# Patient Record
Sex: Female | Born: 1940 | Race: Black or African American | Hispanic: No | Marital: Married | Smoking: Never smoker
Health system: Southern US, Community
[De-identification: ages and names within clinical notes are randomized; demographics above are authoritative.]

---

## 2011-07-15 ENCOUNTER — Emergency Department: Payer: Self-pay | Admitting: Emergency Medicine

## 2011-07-15 LAB — COMPREHENSIVE METABOLIC PANEL
Alkaline Phosphatase: 50 U/L (ref 50–136)
Anion Gap: 12 (ref 7–16)
Calcium, Total: 9.6 mg/dL (ref 8.5–10.1)
Chloride: 102 mmol/L (ref 98–107)
Co2: 25 mmol/L (ref 21–32)
EGFR (African American): >60
EGFR (Non-African Amer.): >60
Osmolality: 281 (ref 275–301)
SGPT (ALT): 24 U/L
Sodium: 139 mmol/L (ref 136–145)

## 2011-07-15 LAB — CBC
MCH: 34.4 pg — ABNORMAL HIGH (ref 26.0–34.0)
MCHC: 33.5 g/dL (ref 32.0–36.0)
MCV: 103 fL — ABNORMAL HIGH (ref 80–100)
Platelet: 178 10*3/uL (ref 150–440)
RBC: 3.8 10*6/uL (ref 3.80–5.20)
RDW: 14.2 % (ref 11.5–14.5)

## 2011-07-15 LAB — TROPONIN I: Troponin-I: <0.02 ng/mL

## 2017-09-14 ENCOUNTER — Other Ambulatory Visit (HOSPITAL_COMMUNITY): Payer: Self-pay | Admitting: Physician Assistant

## 2017-09-14 DIAGNOSIS — M545 Low back pain: Principal | ICD-10-CM

## 2017-09-14 DIAGNOSIS — G8929 Other chronic pain: Secondary | ICD-10-CM

## 2017-10-05 ENCOUNTER — Encounter (HOSPITAL_COMMUNITY): Payer: Self-pay | Admitting: Radiology

## 2017-10-05 ENCOUNTER — Ambulatory Visit (HOSPITAL_COMMUNITY)
Admission: RE | Admit: 2017-10-05 | Discharge: 2017-10-05 | Disposition: A | Discharge status: Home / Self Care | Payer: Self-pay | Source: Ambulatory Visit | Attending: Physician Assistant | Admitting: Physician Assistant

## 2017-10-05 DIAGNOSIS — M5126 Other intervertebral disc displacement, lumbar region: Secondary | ICD-10-CM | POA: Insufficient documentation

## 2017-10-05 DIAGNOSIS — G8929 Other chronic pain: Secondary | ICD-10-CM | POA: Insufficient documentation

## 2017-10-05 DIAGNOSIS — M5441 Lumbago with sciatica, right side: Secondary | ICD-10-CM | POA: Insufficient documentation

## 2017-10-05 DIAGNOSIS — M48061 Spinal stenosis, lumbar region without neurogenic claudication: Secondary | ICD-10-CM | POA: Insufficient documentation

## 2017-10-05 DIAGNOSIS — M5442 Lumbago with sciatica, left side: Secondary | ICD-10-CM | POA: Insufficient documentation

## 2017-10-05 DIAGNOSIS — M545 Low back pain: Secondary | ICD-10-CM

## 2017-10-13 ENCOUNTER — Ambulatory Visit (INDEPENDENT_AMBULATORY_CARE_PROVIDER_SITE_OTHER): Payer: Self-pay | Admitting: Orthopaedic Surgery

## 2017-10-13 DIAGNOSIS — M167 Other unilateral secondary osteoarthritis of hip: Secondary | ICD-10-CM

## 2017-10-13 NOTE — Progress Notes (Signed)
Office Visit Note   Patient: Amber Gross           Date of Birth: Oct 30, 1940           MRN: [id number] Visit Date: 10/13/2017              Requested by: Alyson Ingles, PA-C [address] [place], [place] [address] PCP: Default, Provider, MD   Assessment & Plan: Visit Diagnoses:  1. Other secondary osteoarthritis of right hip     Plan: Impression 77 year old female with severe degenerative joint disease of her right hip.  At this point patient has failed conservative treatment.  I do not think an intra-articular steroid injections and to give her any meaningful relief.  Recommendation is for a total hip replacement at this point.  We discussed risks and benefits associated with the surgery.  We discussed postoperative recovery and rehab.  Patient recently had workup by cardiologist and PCP for thoracic and abdominal aortic aneurysm which were deemed to be stable and recommendations were for monitoring.  We will schedule her surgery in the near future.  All parties in agreement today. Total face to face encounter time was greater than 45 minutes and over half of this time was spent in counseling and/or coordination of care.  Follow-Up Instructions: Return if symptoms worsen or fail to improve.   Orders:  Orders Placed This Encounter  Procedures  . Ambulatory referral to Physical Medicine Rehab   No orders of the defined types were placed in this encounter.     Procedures: No procedures performed   Clinical Data: No additional findings.   Subjective: Chief Complaint  Patient presents with  . Lower Back - Pain  . Right Hip - Pain    Patient is a 77 year old female who presents with severe right hip pain that has progressively gotten worse over the last several years.  She has severe difficulty with ADLs and chronic pain.  She has tried extensive conservative treatment with NSAIDs and gabapentin and use of a cane.  She has difficulty sleeping secondary to the pain.   She did have an MRI of her lumbar spine which was relatively unremarkable.  She was found to have severe degenerative joint disease of her right hip on x-ray.   Review of Systems  Constitutional: Negative.   HENT: Negative.   Eyes: Negative.   Respiratory: Negative.   Cardiovascular: Negative.   Endocrine: Negative.   Musculoskeletal: Negative.   Neurological: Negative.   Hematological: Negative.   Psychiatric/Behavioral: Negative.   All other systems reviewed and are negative.    Objective: Vital Signs: There were no vitals taken for this visit.  Physical Exam  Constitutional: She is oriented to person, place, and time. She appears well-developed and well-nourished.  HENT:  Head: Normocephalic and atraumatic.  Eyes: EOM are normal.  Neck: Neck supple.  Pulmonary/Chest: Effort normal.  Abdominal: Soft.  Neurological: She is alert and oriented to person, place, and time.  Skin: Skin is warm. Capillary refill takes less than 2 seconds.  Psychiatric: She has a normal mood and affect. Her behavior is normal. Judgment and thought content normal.  Nursing note and vitals reviewed.   Ortho Exam Right hip exam shows significant pain with range of motion and catching and limitation.  Positive Stinchfield sign.  Negative sciatic tension.  Right leg is about an inch shorter than the left leg. Specialty Comments:  No specialty comments available.  Imaging: No results found.   PMFS History: There  are no active problems to display for this patient.  No past medical history on file.  No family history on file.   Social History   Occupational History  . Not on file  Tobacco Use  . Smoking status: Not on file  Substance and Sexual Activity  . Alcohol use: Not on file  . Drug use: Not on file  . Sexual activity: Not on file

## 2017-11-09 ENCOUNTER — Telehealth (INDEPENDENT_AMBULATORY_CARE_PROVIDER_SITE_OTHER): Payer: Self-pay | Admitting: Orthopaedic Surgery

## 2017-11-09 NOTE — Telephone Encounter (Signed)
Please advise 

## 2017-11-09 NOTE — Telephone Encounter (Signed)
Patient called requesting a note findings of xrays and that she needs surgery. Note needs to include date she is having surgery and name of procedure. Call when ready. [phone]

## 2017-11-09 NOTE — Telephone Encounter (Signed)
Can you please let me know what day surgery is scheduled for so that I can fix note for patient?

## 2017-11-09 NOTE — Telephone Encounter (Signed)
Right hip DJD and right total hip replacement

## 2017-11-10 NOTE — Telephone Encounter (Signed)
Please see below. Can you let me know about surgery date?

## 2017-11-12 NOTE — Telephone Encounter (Signed)
Amber Gross, when is surgery?  You hold the key to the [facility]

## 2017-11-12 NOTE — Telephone Encounter (Signed)
Can you check on surgery date so we can fix patient note? Thanks.

## 2017-11-12 NOTE — Telephone Encounter (Signed)
Do you know when the SU was/is? Sherrie has not responded. Thank you.

## 2017-11-13 NOTE — Telephone Encounter (Signed)
Was planning for it to be on Thursday, June 13 but since your OR schedule changed I have to call patient and reschedule for a different date.

## 2017-11-13 NOTE — Telephone Encounter (Signed)
Ok thanks 

## 2017-11-18 NOTE — Telephone Encounter (Signed)
I called patient and left voice mail for return call to schedule surgery. 

## 2017-12-02 NOTE — Telephone Encounter (Signed)
Spoke with patient, surgery now planned for 01/04/18.

## 2017-12-03 ENCOUNTER — Encounter (INDEPENDENT_AMBULATORY_CARE_PROVIDER_SITE_OTHER): Payer: Self-pay

## 2017-12-03 NOTE — Telephone Encounter (Signed)
Called patient letter ready for pick up.

## 2017-12-11 ENCOUNTER — Other Ambulatory Visit (INDEPENDENT_AMBULATORY_CARE_PROVIDER_SITE_OTHER): Payer: Self-pay

## 2017-12-25 NOTE — Pre-Procedure Instructions (Signed)
Amber Gross  12/25/2017    Your procedure is scheduled on Monday, January 04, 2018 at 7:30 AM.   [facility] at 5:30 AM.   Call this number if you have problems the morning of surgery: [phone]   Questions prior to day of surgery, please call [phone] between 8 & 4 PM.   Remember:  Do not eat or drink after midnight Sunday, 01/03/18.  Take these medicines the morning of surgery with A SIP OF WATER:  Stop Aspirin as instructed by physician. Do not use NSAIDS (Ibuprofen, Aleve, etc) prior to surgery.    Do not wear jewelry, make-up or nail polish.  Do not wear lotions, powders, perfumes or deodorant.  Do not shave 48 hours prior to surgery.    Do not bring valuables to the hospital.  [facility] is not responsible for any belongings or valuables.  Contacts, dentures or bridgework may not be worn into surgery.  Leave your suitcase in the car.  After surgery it may be brought to your room.  For patients admitted to the hospital, discharge time will be determined by your treatment team.  Patients discharged the day of surgery will not be allowed to drive home.   [place] - Preparing for Surgery  Before surgery, you can play an important role.  Because skin is not sterile, your skin needs to be as free of germs as possible.  You can reduce the number of germs on you skin by washing with CHG (chlorahexidine gluconate) soap before surgery.  CHG is an antiseptic cleaner which kills germs and bonds with the skin to continue killing germs even after washing.  Oral Hygiene is also important in reducing the risk of infection.  Remember to brush your teeth with your regular toothpaste the morning of surgery.  Please DO NOT use if you have an allergy to CHG or antibacterial soaps.  If your skin becomes reddened/irritated stop using the CHG and inform your nurse when you arrive at Short Stay.  Do not shave (including legs and underarms) for  at least 48 hours prior to the first CHG shower.  You may shave your face.  Please follow these instructions carefully:   1.  Shower with CHG Soap the night before surgery and the morning of Surgery.  2.  If you choose to wash your hair, wash your hair first as usual with your normal shampoo.  3.  After you shampoo, rinse your hair and body thoroughly to remove the shampoo. 4.  Use CHG as you would any other liquid soap.  You can apply chg directly to the skin and wash gently with a      scrungie or washcloth.           5.  Apply the CHG Soap to your body ONLY FROM THE NECK DOWN.   Do not use on open wounds or open sores. Avoid contact with your eyes, ears, mouth and genitals (private parts).  Wash genitals (private parts) with your normal soap.  6.  Wash thoroughly, paying special attention to the area where your surgery will be performed.  7.  Thoroughly rinse your body with warm water from the neck down.  8.  DO NOT shower/wash with your normal soap after using and rinsing off the CHG Soap.  9.  Pat yourself dry with a clean towel.            10 .  Wear clean pajamas.  11.  Place clean sheets on your bed the night of your first shower and do not sleep with pets.  Day of Surgery  Shower as above. Do not apply any lotions/deodorants the morning of surgery.   Please wear clean clothes to the hospital. Remember to brush your teeth with toothpaste.   Please read over the fact sheets that you were given.

## 2017-12-28 ENCOUNTER — Inpatient Hospital Stay (HOSPITAL_COMMUNITY)
Admission: RE | Admit: 2017-12-28 | Discharge: 2017-12-28 | Disposition: A | Discharge status: Home / Self Care | Payer: Self-pay | Source: Ambulatory Visit

## 2017-12-28 ENCOUNTER — Encounter (HOSPITAL_COMMUNITY): Payer: Self-pay

## 2017-12-28 NOTE — Progress Notes (Signed)
Pt called and stated that she is needs to cancel this PAT appt because she is having to reschedule her surgery.

## 2017-12-31 ENCOUNTER — Inpatient Hospital Stay (INDEPENDENT_AMBULATORY_CARE_PROVIDER_SITE_OTHER): Payer: Self-pay | Admitting: Orthopaedic Surgery

## 2018-01-04 ENCOUNTER — Encounter (HOSPITAL_COMMUNITY): Admission: RE | Payer: Self-pay | Source: Ambulatory Visit

## 2018-01-04 ENCOUNTER — Inpatient Hospital Stay (HOSPITAL_COMMUNITY): Admission: RE | Admit: 2018-01-04 | Payer: Self-pay | Source: Ambulatory Visit | Admitting: Orthopaedic Surgery

## 2018-01-04 SURGERY — ARTHROPLASTY, HIP, TOTAL, ANTERIOR APPROACH
Anesthesia: Spinal | Site: Hip | Laterality: Right

## 2019-01-14 IMAGING — MR MR LUMBAR SPINE W/O CM
4 of 5 series · 26 of 48 positions shown · non-contrast
Comparison: Plain films 09/14/2017.

CLINICAL DATA: Low back pain extending to RIGHT hip, symptoms for
many years, worsening with time. No known injury.

EXAM:
MRI LUMBAR SPINE WITHOUT CONTRAST
TECHNIQUE: Multiplanar, multisequence MR imaging of the lumbar spine was
performed. No intravenous contrast was administered.

[Series 9001: T2 · sagittal · 4.0mm · 0.73mm/px · 6 of 13 slices shown (1 of 2)]
[im 1/13]
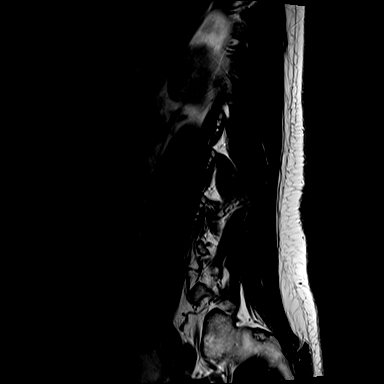
[im 3/13]
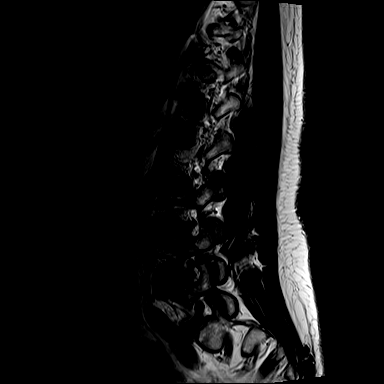
[im 5/13]
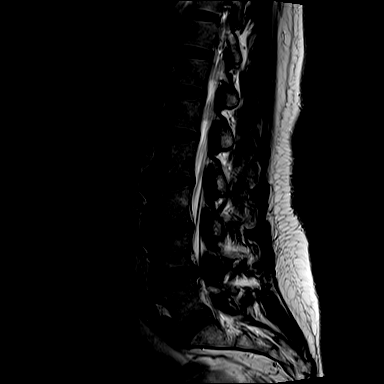
[im 8/13]
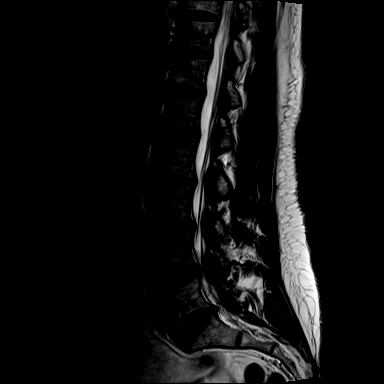
[im 10/13]
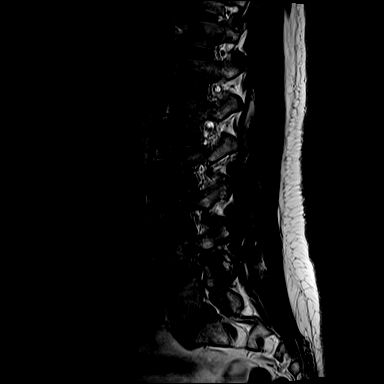
[im 13/13]
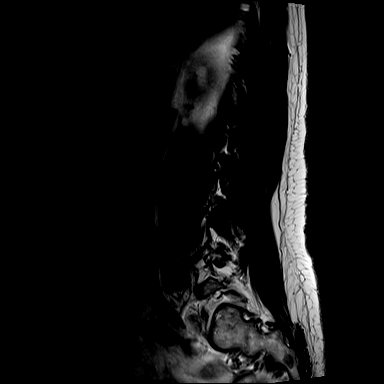

[T1 · sagittal · 4.0mm · 0.88mm/px · 6 of 13 slices shown (1 of 2)]
[im 1/13]
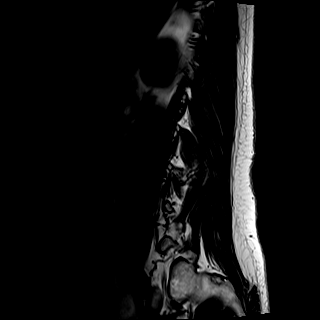
[im 3/13]
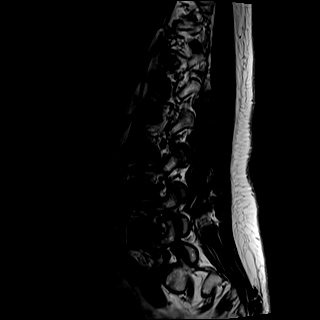
[im 5/13]
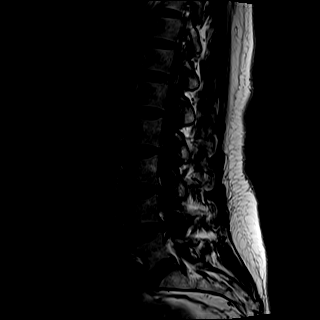
[im 8/13]
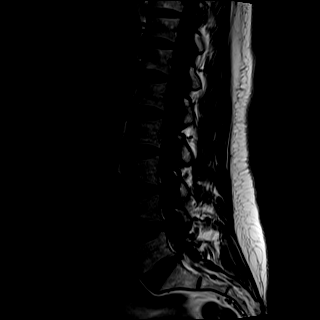
[im 10/13]
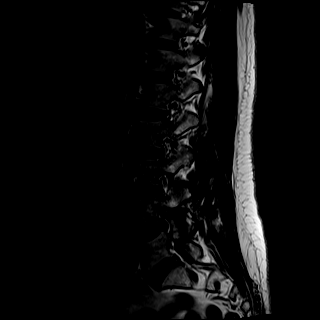
[im 13/13]
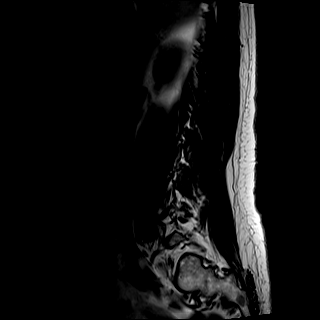

[T2 · axial · 4.0mm · 0.57mm/px · z∈[-50,+160]mm · 9 of 34 slices shown (2 of 2)]
[im 1/34]
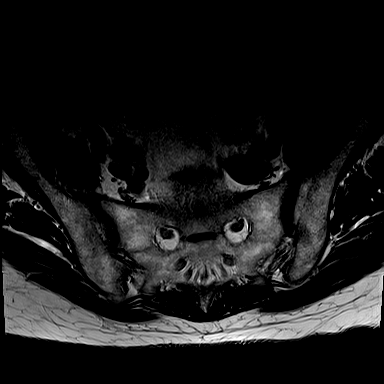
[im 5/34]
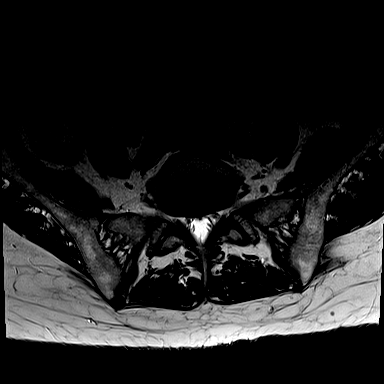
[im 10/34]
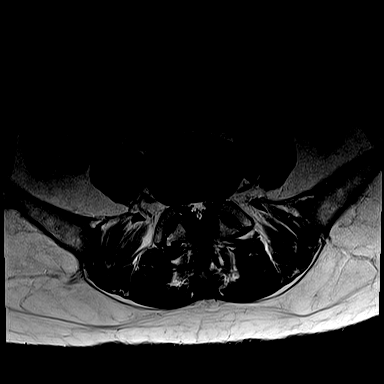
[im 15/34]
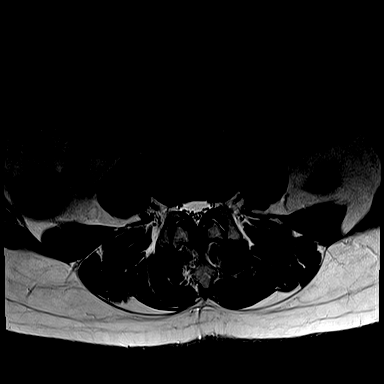
[im 17/34]
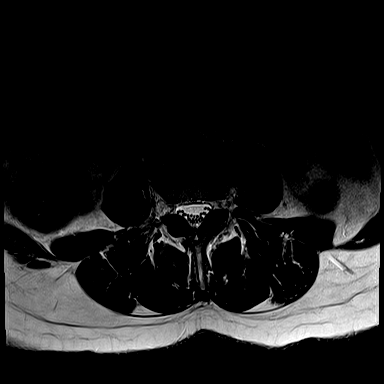
[im 19/34]
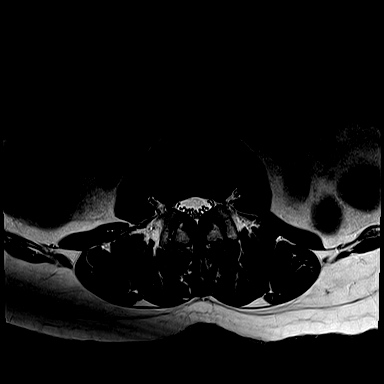
[im 24/34]
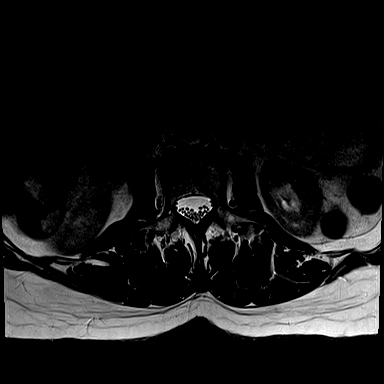
[im 29/34]
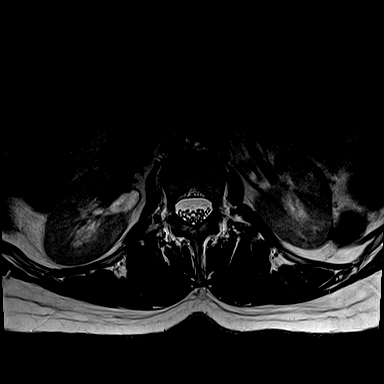
[im 34/34]
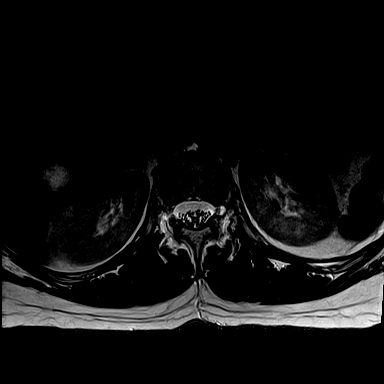

[T1 · axial · 4.0mm · 0.34mm/px · z∈[-50,+136]mm · 5 of 34 slices shown (2 of 2)]
[im 1/34]
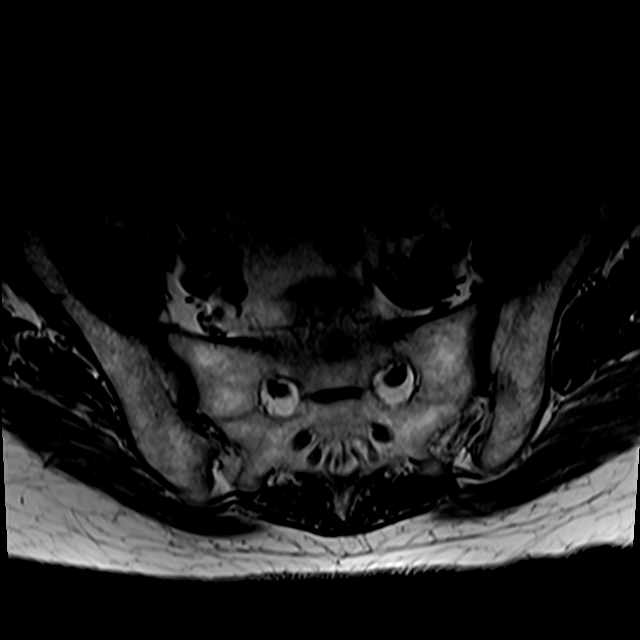
[im 5/34]
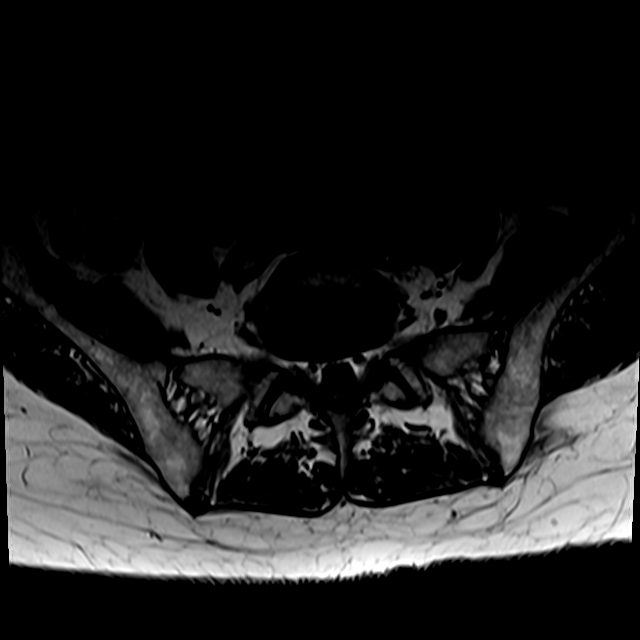
[im 10/34]
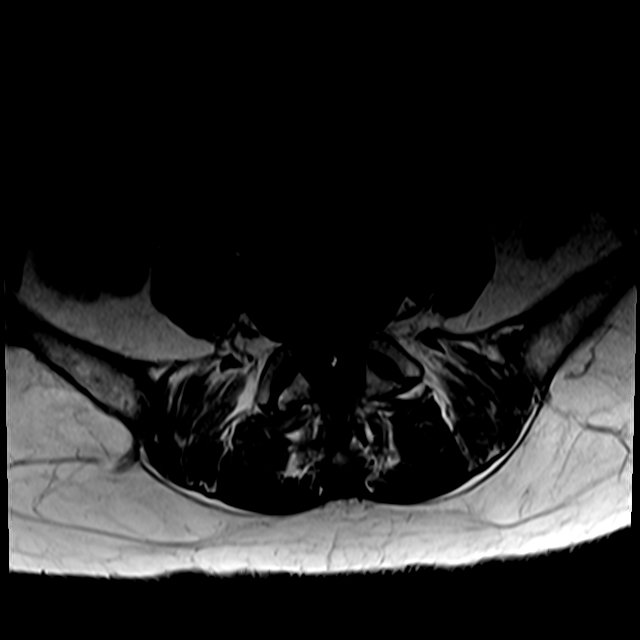
[im 17/34]
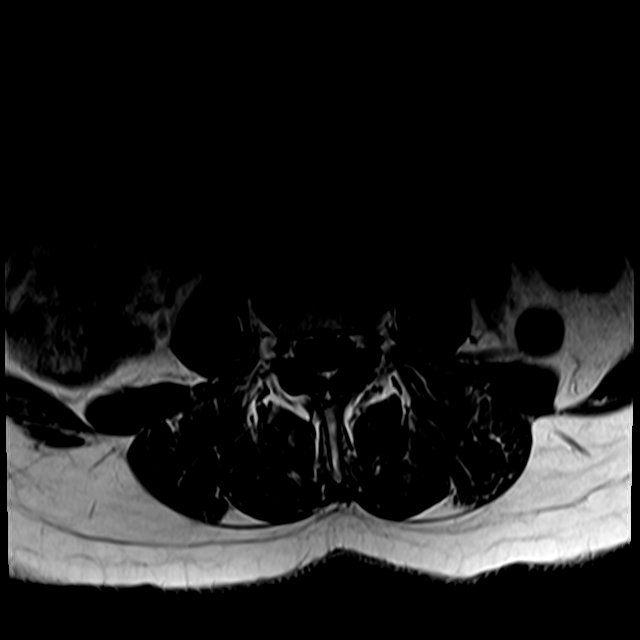
[im 29/34]
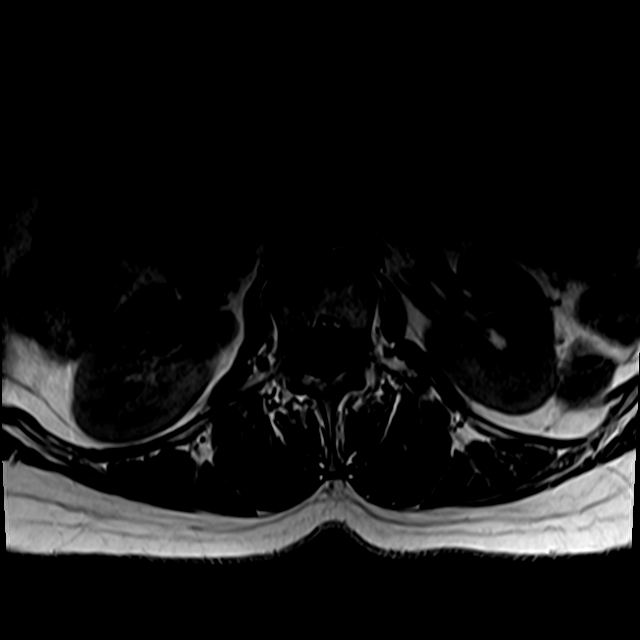

[26 of 48 positions shown; findings below may reference images not displayed]

FINDINGS: Segmentation:  Standard.

Alignment:  Anatomic.

Vertebrae:  No fracture, diskitis, or bone lesion.

Conus medullaris and cauda equina: Conus extends to the L1. level.
Conus and cauda equina appear normal.

Paraspinal and other soft tissues: Unremarkable.

Disc levels:

L1-L2:  Annular bulge.  Facet arthropathy.  No impingement.

L2-L3:  Annular bulge.  Facet arthropathy.  No impingement.

L3-L4: Shallow central protrusion. Posterior element hypertrophy. No
impingement.

L4-L5: Shallow central protrusion extends to both foramina. There is
posterior element hypertrophy affecting facets ligamentum flavum.
Mild stenosis. BILATERAL subarticular zone and foraminal zone
narrowing could affect the L4 and L5 nerve roots.

L5-S1: Focal protrusion in the midline/annular rent. Minimal mass
effect on the RIGHT greater than LEFT S1 nerve roots. No foraminal
narrowing of significance.
IMPRESSION: The dominant abnormality is at L4-5, where a shallow central
protrusion and posterior element hypertrophy contribute to mild
stenosis. Correlate clinically for symptomatic L4 and/or L5 nerve
root impingement.

Shallow protrusions at L3-4 and L5-S1, without significant mass
effect or spinal stenosis.

## 2024-07-28 ENCOUNTER — Ambulatory Visit: Payer: Self-pay

## 2024-07-28 NOTE — Telephone Encounter (Signed)
 FYI Only or Action Required?: FYI only for provider: ED advised.  Patient was last seen in primary care on N/A.  Called Nurse Triage reporting Chest Pain, Headache, Constipation, and Dizziness.  Symptoms began several weeks ago.  Interventions attempted: Other: followed up with cardiology and GI.  Symptoms are: becoming more frequent.  Triage Disposition: Go to ED Now (Notify PCP)  Patient/caregiver understands and will follow disposition?: Yes              Message from [facility] sent at 07/28/2024  9:00 AM EST  Reason for Triage: Chest pains, had work ups already and nothing showing wrong dizziness and tension   Reason for Disposition  [1] Chest pain (or angina) comes and goes AND [2] is happening more often (increasing in frequency) or getting worse (increasing in severity)  (Exception: Chest pains that last only a few seconds.)  Answer Assessment - Initial Assessment Questions Daughter, Reena, on the phone for triage. States she is with patient. Calling in for left upper chest pain that comes and goes but becoming more frequent. Last time it occurred was yesterday, not present now (30 minutes to the entire day duration). Also mentions tension in head, constipation and dizziness.Patient reports to daughter :Chest pain feels related to her constipation. RN scheduled patient for new patient appt and advised daughter for patient to be seen in ED now.   No difficulty breathing,  Protocols used: Chest Pain-A-AH

## 2024-08-03 ENCOUNTER — Encounter: Payer: Self-pay | Admitting: Family Medicine

## 2024-08-03 ENCOUNTER — Ambulatory Visit
Admission: RE | Admit: 2024-08-03 | Discharge: 2024-08-03 | Disposition: A | Discharge status: Home / Self Care | Payer: Self-pay | Attending: Family Medicine | Admitting: Family Medicine

## 2024-08-03 ENCOUNTER — Ambulatory Visit
Admission: RE | Admit: 2024-08-03 | Discharge: 2024-08-03 | Disposition: A | Payer: Self-pay | Source: Ambulatory Visit | Attending: Family Medicine | Admitting: Family Medicine

## 2024-08-03 ENCOUNTER — Ambulatory Visit: Payer: Self-pay | Admitting: Family Medicine

## 2024-08-03 VITALS — BP 170/84 | HR 77 | Temp 98.3°F | Resp 16 | Ht 65.0 in | Wt 152.6 lb

## 2024-08-03 DIAGNOSIS — I2510 Atherosclerotic heart disease of native coronary artery without angina pectoris: Secondary | ICD-10-CM | POA: Insufficient documentation

## 2024-08-03 DIAGNOSIS — R0789 Other chest pain: Secondary | ICD-10-CM | POA: Insufficient documentation

## 2024-08-03 DIAGNOSIS — R030 Elevated blood-pressure reading, without diagnosis of hypertension: Secondary | ICD-10-CM

## 2024-08-03 DIAGNOSIS — R079 Chest pain, unspecified: Secondary | ICD-10-CM

## 2024-08-03 DIAGNOSIS — I25118 Atherosclerotic heart disease of native coronary artery with other forms of angina pectoris: Secondary | ICD-10-CM

## 2024-08-03 NOTE — Assessment & Plan Note (Addendum)
 She reports that her chest tightness seems to be getting worse in the last couple of weeks.  She denies pain to swallow, shortness of breath, nausea, diaphoresis, presyncope.  She has never been a smoker and she does not drink alcohol.  No diabetes.  Left heart catheterization 03/04/2024 showed no atheromatous or obstructive lesions in left main, LAD has partially calcified plaque throughout the proximal LAD resulting in less than 25% stenosis no significant plaque or stenosis in the prominent diagonal branch and septal perforator branches. Left circumflex artery has no atheromatous or obstructive disease. RCA shows a partially calcified plaque involving mid to proximal to mid RCA with less than 30% stenosis.  Oral sublingual nitroglycerin does improve the chest tightness but causes dizziness. EKG was normal without ST or T wave changes associated with ischemia.   Asked her to check a chest x-ray today and CMP, CBC, thyroid and lipids

## 2024-08-03 NOTE — Progress Notes (Signed)
 "  New Patient Office Visit  Subjective    Patient ID: Amber Gross, female    DOB: June 09, 1941  Age: 84 y.o. MRN: [id number]  CC:  Chief Complaint  Patient presents with   Establish Care    Here to establish care.   Chest Pain    Left upper chest pain that comes and goes but becoming more frequent. Also mentioned tension in head, constipation and dizziness.Patient reports to daughter :Chest pain feels related to her constipation. Was advised to go ED but she said it was not severe enough. They have copies of angiograms done in past.    HPI Amber Gross presents to establish care. Discussed the use of AI scribe software for clinical note transcription with the patient, who gave verbal consent to proceed.  History of Present Illness   Amber Gross is an 84 year old female with coronary artery disease who presents with chest tightness. She is accompanied by her daughter, Amber Gross.  She experiences a constant sensation of tightness or tension in her chest, which has been worsening over the past month. The sensation, described as a feeling of 'something's not right' rather than a throbbing pain, has been present since mid-December and is rated as an 8 out of 10 in intensity.  Despite extensive cardiac workup, including a LHC 03/04/2024 that showed no erythematous or obstructive lesions in left main, LAD has partially calcified plaque throughout the proximal LAD resulting in less than 25% stenosis no significant plaque or stenosis in the prominent diagonal branch and septal perforator branches.  Left circumflex artery has no erythematous or obstructive disease.  RCA shows a partially calcified plaque involving mid to proximal to mid RCA with less than 30% stenosis.  Cardiac chambers are normal in volume and without filling defect the atrial septum is intact, trileaflet aortic valve without leaflet thickening or calcification.  Coronary calcium score 185 which is 65th percentile for coronary artery  disease in comparison to asymptomatic patients of same age and gender.   The chest tightness persists.   She has been prescribed nitroglycerin for as-needed use, which helps alleviate the tightness but has caused dizziness on two occasions. She has also been on omeprazole for two months without significant improvement in symptoms. No shortness of breath, nausea, cold sweats, or pain associated with eating.  She has a history of megacolon and uses multiple medications to aid with bowel movements. Straining during defecation exacerbates the chest tightness. She has experienced significant weight loss, losing 15 to 20 pounds, but has recently regained some weight.  Her social history includes a healthy diet with no smoking or alcohol use. She has never been a smoker and avoids exposure to smoke. Her cholesterol levels are well-controlled with a total cholesterol of 199, HDL of 77, and LDL of 105.     Outpatient Encounter Medications as of 08/03/2024  Medication Sig   b complex vitamins capsule Take 1 capsule by mouth daily.   Coenzyme Q10 (CO Q 10 PO) Take by mouth daily.   glycerin SUPP Place 1 Chip rectally as needed for moderate constipation.   magnesium hydroxide (MILK OF MAGNESIA) 400 MG/5ML suspension Take by mouth daily as needed for mild constipation.   nitroGLYCERIN (NITROSTAT) 0.4 MG SL tablet Place 0.4 mg under the tongue every 5 (five) minutes as needed for chest pain.   omeprazole (PRILOSEC OTC) 20 MG tablet Take 20 mg by mouth daily.   No facility-administered encounter medications on file as of 08/03/2024.  History reviewed. No pertinent past medical history.  Past Surgical History:  Procedure Laterality Date   cataract surgery Left    heart catherization  03/04/2024   right hip surgery     TOTAL ABDOMINAL HYSTERECTOMY W/ BILATERAL SALPINGOOPHORECTOMY      Family History  Problem Relation Age of Onset   Hypertension Mother        Objective   BP (!) 170/84   Pulse  77   Temp 98.3 F (36.8 C) (Oral)   Resp 16   Ht 5' 5 (1.651 m)   Wt 152 lb 9.6 oz (69.2 kg)   SpO2 98%   BMI 25.39 kg/m    Physical Exam Vitals and nursing note reviewed.  Constitutional:      Appearance: Normal appearance.  HENT:     Head: Normocephalic and atraumatic.  Eyes:     Conjunctiva/sclera: Conjunctivae normal.  Cardiovascular:     Rate and Rhythm: Normal rate and regular rhythm.  Pulmonary:     Effort: Pulmonary effort is normal.     Breath sounds: Normal breath sounds.  Musculoskeletal:     Right lower leg: No edema.     Left lower leg: No edema.  Skin:    General: Skin is warm and dry.  Neurological:     Mental Status: She is alert and oriented to person, place, and time.  Psychiatric:        Mood and Affect: Mood normal.        Behavior: Behavior normal.        Thought Content: Thought content normal.        Judgment: Judgment normal.            The ASCVD Risk score (Arnett DK, et al., 2019) failed to calculate for the following reasons:   The 2019 ASCVD risk score is only valid for ages 39 to 53   * - Cholesterol units were assumed     Assessment & Plan:  Chest pain, unspecified type -     DG Chest 2 View; Future -     EKG 12-Lead  Elevated blood pressure reading Assessment & Plan: Reports that her blood pressure is usually in the 170s over 80s when she first presents and then after she has had some time to calm down her blood pressure comes down.  Today's blood pressure did not come down.  Her second reading was 170/84.  Checking urine albumin creatinine ratio.  She does not have a BP cuff at this time.   Asked her to follow-up in 1 week after getting her CXR and labs  Orders: -     DG Chest 2 View; Future -     CBC with Differential/Platelet -     Comprehensive metabolic panel with GFR -     Lipid panel -     TSH -     T4, free -     Urinalysis -     Microalbumin / creatinine urine ratio  Coronary artery disease of native artery  of native heart with stable angina pectoris  Chest tightness Assessment & Plan: She reports that her chest tightness seems to be getting worse in the last couple of weeks.  She denies pain to swallow, shortness of breath, nausea, diaphoresis, presyncope.  She has never been a smoker and she does not drink alcohol.  No diabetes.  Left heart catheterization 03/04/2024 showed no atheromatous or obstructive lesions in left main, LAD has partially calcified plaque throughout the  proximal LAD resulting in less than 25% stenosis no significant plaque or stenosis in the prominent diagonal branch and septal perforator branches. Left circumflex artery has no atheromatous or obstructive disease. RCA shows a partially calcified plaque involving mid to proximal to mid RCA with less than 30% stenosis.  Oral sublingual nitroglycerin does improve the chest tightness but causes dizziness. EKG was normal without ST or T wave changes associated with ischemia.   Asked her to check a chest x-ray today and CMP, CBC, thyroid and lipids     Return in about 1 week (around 08/10/2024).   Amber Gross K Dejai Schubach, MD  "

## 2024-08-03 NOTE — Assessment & Plan Note (Signed)
 Reports that her blood pressure is usually in the 170s over 80s when she first presents and then after she has had some time to calm down her blood pressure comes down.  Today's blood pressure did not come down.  Her second reading was 170/84.  Checking urine albumin creatinine ratio.  She does not have a BP cuff at this time.   Asked her to follow-up in 1 week after getting her CXR and labs

## 2024-08-04 ENCOUNTER — Ambulatory Visit: Payer: Self-pay | Admitting: Family Medicine

## 2024-08-04 LAB — COMPREHENSIVE METABOLIC PANEL WITH GFR
ALT: 7 [IU]/L (ref 0–32)
AST: 13 [IU]/L (ref 0–40)
Albumin: 4.3 g/dL (ref 3.7–4.7)
Alkaline Phosphatase: 62 [IU]/L (ref 48–129)
BUN/Creatinine Ratio: 10 — ABNORMAL LOW (ref 12–28)
BUN: 11 mg/dL (ref 8–27)
Bilirubin Total: 0.4 mg/dL (ref 0.0–1.2)
CO2: 22 mmol/L (ref 20–29)
Calcium: 9.5 mg/dL (ref 8.7–10.3)
Chloride: 101 mmol/L (ref 96–106)
Creatinine, Ser: 1.06 mg/dL — ABNORMAL HIGH (ref 0.57–1.00)
Globulin, Total: 2.8 g/dL (ref 1.5–4.5)
Glucose: 106 mg/dL — ABNORMAL HIGH (ref 70–99)
Potassium: 4.3 mmol/L (ref 3.5–5.2)
Sodium: 138 mmol/L (ref 134–144)
Total Protein: 7.1 g/dL (ref 6.0–8.5)
eGFR: 53 mL/min/{1.73_m2} — ABNORMAL LOW

## 2024-08-04 LAB — MICROALBUMIN / CREATININE URINE RATIO
Creatinine, Urine: 34.2 mg/dL
Microalb/Creat Ratio: 18 mg/g{creat} (ref 0–29)
Microalbumin, Urine: 6.2 ug/mL

## 2024-08-04 LAB — URINALYSIS
Bilirubin, UA: NEGATIVE
Glucose, UA: NEGATIVE
Ketones, UA: NEGATIVE
Nitrite, UA: NEGATIVE
Protein,UA: NEGATIVE
Specific Gravity, UA: 1.009 (ref 1.005–1.030)
Urobilinogen, Ur: 0.2 mg/dL (ref 0.2–1.0)
pH, UA: 6 (ref 5.0–7.5)

## 2024-08-04 LAB — CBC WITH DIFFERENTIAL/PLATELET
Basophils Absolute: 0 10*3/uL (ref 0.0–0.2)
Basos: 0 %
EOS (ABSOLUTE): 0 10*3/uL (ref 0.0–0.4)
Eos: 1 %
Hematocrit: 38.3 % (ref 34.0–46.6)
Hemoglobin: 12.4 g/dL (ref 11.1–15.9)
Immature Grans (Abs): 0 10*3/uL (ref 0.0–0.1)
Immature Granulocytes: 0 %
Lymphocytes Absolute: 1.4 10*3/uL (ref 0.7–3.1)
Lymphs: 30 %
MCH: 34.4 pg — ABNORMAL HIGH (ref 26.6–33.0)
MCHC: 32.4 g/dL (ref 31.5–35.7)
MCV: 106 fL — ABNORMAL HIGH (ref 79–97)
Monocytes Absolute: 0.4 10*3/uL (ref 0.1–0.9)
Monocytes: 9 %
Neutrophils Absolute: 2.7 10*3/uL (ref 1.4–7.0)
Neutrophils: 60 %
Platelets: 165 10*3/uL (ref 150–450)
RBC: 3.6 x10E6/uL — ABNORMAL LOW (ref 3.77–5.28)
RDW: 12.3 % (ref 11.7–15.4)
WBC: 4.6 10*3/uL (ref 3.4–10.8)

## 2024-08-04 LAB — LIPID PANEL
Chol/HDL Ratio: 3.3 ratio (ref 0.0–4.4)
Cholesterol, Total: 299 mg/dL — ABNORMAL HIGH (ref 100–199)
HDL: 92 mg/dL
LDL Chol Calc (NIH): 183 mg/dL — ABNORMAL HIGH (ref 0–99)
Triglycerides: 136 mg/dL (ref 0–149)
VLDL Cholesterol Cal: 24 mg/dL (ref 5–40)

## 2024-08-04 LAB — TSH: TSH: 2.42 u[IU]/mL (ref 0.450–4.500)

## 2024-08-04 LAB — T4, FREE: Free T4: 1.01 ng/dL (ref 0.82–1.77)

## 2024-08-08 ENCOUNTER — Telehealth: Payer: Self-pay | Admitting: Family Medicine

## 2024-08-08 NOTE — Telephone Encounter (Signed)
 Called pt to confirm appt but lvm doing so. Also requested pt to cb facility if they cannot keep appt.

## 2024-08-09 ENCOUNTER — Ambulatory Visit: Payer: Self-pay | Admitting: Family Medicine

## 2024-08-09 VITALS — BP 173/81 | HR 70 | Ht 65.0 in | Wt 152.0 lb

## 2024-08-09 DIAGNOSIS — R0789 Other chest pain: Secondary | ICD-10-CM

## 2024-08-09 DIAGNOSIS — K5904 Chronic idiopathic constipation: Secondary | ICD-10-CM

## 2024-08-10 DIAGNOSIS — K5900 Constipation, unspecified: Secondary | ICD-10-CM | POA: Insufficient documentation

## 2024-08-10 DIAGNOSIS — R079 Chest pain, unspecified: Secondary | ICD-10-CM | POA: Insufficient documentation

## 2024-08-10 NOTE — Assessment & Plan Note (Signed)
 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  Add Colace 100 to 200 mg daily to current regimen of Metamucil/prunes/fleets enema/MOM twice a week.

## 2024-08-10 NOTE — Progress Notes (Signed)
 "  Established Patient Office Visit  Subjective   Patient ID: Amber Gross, female    DOB: [birth year]  Age: 84 y.o. MRN: [id number]  Chief Complaint  Patient presents with   Follow-up    HPI Discussed the use of AI scribe software for clinical note transcription with the patient, who gave verbal consent to proceed.  History of Present Illness   Amber Gross is an 84 year old female who presents with chest tension and pain. She is accompanied by her daughter.  She has been experiencing chest tension and pain for several weeks, describing it as a 'tension' rather than sharp pain. The sensation is rated as an eight out of ten in intensity and is not constant, but more intense during bowel movements and when lifting heavy objects. She describes the sensation as feeling like 'pressure on the heart' when lifting or straining. She cannot open the microwave because she has to pull hard on the door and that makes her chest hurt.  Today she shows me her epigastric region as the area with the tension.  The pain feels superficial and not deep.  The pain does not radiate into her back.  Eating has no effect on her pain.   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  Most  She has a history of using nitroglycerin and reports feeling better after taking it. She has been using Metamucil daily to aid bowel movements and has recently increased her use of milk of magnesia to twice a week. She also uses prunes and Fleet enemas to manage her bowel movements.  Her past medical history includes a previous left heart catheterization and a recent chest x-ray. She has a history of elevated cholesterol, with a recent test showing a total cholesterol of 300 mg/dL and LDL of 816 mg/dL, although she follows a Mediterranean diet. She was not fasting during the cholesterol test.  No significant issues with swallowing or heartburn, and her recent thyroid function tests were normal. Her blood glucose was slightly  elevated at 106 mg/dL. She has a history of large red blood cells, MCV 106, possibly due to B12 or folate deficiency, but she is currently taking B12 supplements.        Objective:     BP (!) 173/81   Pulse 70   Ht 5' 5 (1.651 m)   Wt 152 lb (68.9 kg)   SpO2 98%   BMI 25.29 kg/m    Physical Exam Vitals and nursing note reviewed.  Constitutional:      Appearance: Normal appearance.  HENT:     Head: Normocephalic and atraumatic.  Eyes:     Conjunctiva/sclera: Conjunctivae normal.  Cardiovascular:     Rate and Rhythm: Normal rate and regular rhythm.  Pulmonary:     Effort: Pulmonary effort is normal.     Breath sounds: Normal breath sounds.  Musculoskeletal:        General: No tenderness (palpation of sternum does not reproduce pain).     Right lower leg: No edema.     Left lower leg: No edema.  Skin:    General: Skin is warm and dry.  Neurological:     Mental Status: She is alert and oriented to person, place, and time.  Psychiatric:        Mood and Affect: Mood normal.        Behavior: Behavior normal.        Thought Content: Thought content normal.  Judgment: Judgment normal.          No results found for any visits on 08/09/24.    The ASCVD Risk score (Arnett DK, et al., 2019) failed to calculate for the following reasons:   The 2019 ASCVD risk score is only valid for ages 41 to 60   * - Cholesterol units were assumed    Assessment & Plan:  Other chest pain Assessment & Plan: Constant chest pain described as tension, rated 8 out of 10 in severity that worsens with bowel movements and lifting.  Nitroglycerin provides relief suggesting possible esophageal involvement differential includes musculoskeletal pain, esophageal spasm or peptic ulcer disease.  Cardiac causes are unlikely given normal cardiac catheterization and EKG.  No evidence of metastatic bone lesions on chest x-ray.  Referring to gastroenterology for evaluation of possible esophageal  spasm or other esophageal etiology.  Will consider CT of the chest of gastroenterology evaluation is inconclusive.  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  Add Colace 100 to 200 mg daily to current regimen of Metamucil/prunes/fleets enema/MOM twice a week.      Return in about 2 weeks (around 08/23/2024).    Tamara Monteith K Ethelyne Erich, MD "

## 2024-08-10 NOTE — Assessment & Plan Note (Signed)
 Constant chest pain described as tension, rated 8 out of 10 in severity that worsens with bowel movements and lifting.  Nitroglycerin provides relief suggesting possible esophageal involvement differential includes musculoskeletal pain, esophageal spasm or peptic ulcer disease.  Cardiac causes are unlikely given normal cardiac catheterization and EKG.  No evidence of metastatic bone lesions on chest x-ray.  Referring to gastroenterology for evaluation of possible esophageal spasm or other esophageal etiology.  Will consider CT of the chest of gastroenterology evaluation is inconclusive.

## 2024-08-23 ENCOUNTER — Ambulatory Visit: Payer: Self-pay | Admitting: Family Medicine

## 2024-10-04 ENCOUNTER — Ambulatory Visit: Payer: Self-pay | Admitting: Internal Medicine

## 2024-10-20 ENCOUNTER — Ambulatory Visit: Payer: Self-pay | Admitting: Nurse Practitioner
